# Patient Record
Sex: Female | Born: 1965 | Race: Black or African American | Hispanic: No | Marital: Single | Smoking: Former smoker
Health system: Southern US, Community
[De-identification: ages and names within clinical notes are randomized; demographics above are authoritative.]

## PROBLEM LIST (undated history)

## (undated) DIAGNOSIS — I10 Essential (primary) hypertension: Secondary | ICD-10-CM

## (undated) HISTORY — DX: Essential (primary) hypertension: I10

## (undated) HISTORY — PX: CARPAL TUNNEL RELEASE: SHX101

## (undated) HISTORY — PX: CHOLECYSTECTOMY: SHX55

## (undated) HISTORY — PX: TUBAL LIGATION: SHX77

## (undated) HISTORY — PX: HIP SURGERY: SHX245

---

## 2003-01-13 ENCOUNTER — Emergency Department (HOSPITAL_COMMUNITY): Admission: EM | Admit: 2003-01-13 | Discharge: 2003-01-14 | Payer: Self-pay | Admitting: Emergency Medicine

## 2014-12-03 ENCOUNTER — Encounter (HOSPITAL_COMMUNITY): Payer: Self-pay | Admitting: Emergency Medicine

## 2014-12-03 ENCOUNTER — Emergency Department (HOSPITAL_COMMUNITY)
Admission: EM | Admit: 2014-12-03 | Discharge: 2014-12-04 | Disposition: A | Discharge status: Home / Self Care | Payer: Self-pay | Attending: Emergency Medicine | Admitting: Emergency Medicine

## 2014-12-03 DIAGNOSIS — D259 Leiomyoma of uterus, unspecified: Secondary | ICD-10-CM | POA: Insufficient documentation

## 2014-12-03 DIAGNOSIS — Z9851 Tubal ligation status: Secondary | ICD-10-CM | POA: Insufficient documentation

## 2014-12-03 DIAGNOSIS — R102 Pelvic and perineal pain: Secondary | ICD-10-CM | POA: Insufficient documentation

## 2014-12-03 DIAGNOSIS — R21 Rash and other nonspecific skin eruption: Secondary | ICD-10-CM | POA: Insufficient documentation

## 2014-12-03 DIAGNOSIS — Z9049 Acquired absence of other specified parts of digestive tract: Secondary | ICD-10-CM | POA: Insufficient documentation

## 2014-12-03 DIAGNOSIS — R109 Unspecified abdominal pain: Secondary | ICD-10-CM

## 2014-12-03 DIAGNOSIS — Z720 Tobacco use: Secondary | ICD-10-CM | POA: Insufficient documentation

## 2014-12-03 DIAGNOSIS — N830 Follicular cyst of ovary: Secondary | ICD-10-CM | POA: Insufficient documentation

## 2014-12-03 DIAGNOSIS — N83202 Unspecified ovarian cyst, left side: Secondary | ICD-10-CM

## 2014-12-03 LAB — COMPREHENSIVE METABOLIC PANEL
ALK PHOS: 82 U/L (ref 39–117)
ALT: 13 U/L (ref 0–35)
AST: 17 U/L (ref 0–37)
Albumin: 3.4 g/dL — ABNORMAL LOW (ref 3.5–5.2)
Anion gap: 7 (ref 5–15)
BUN: 8 mg/dL (ref 6–23)
CO2: 22 mmol/L (ref 19–32)
Calcium: 8.8 mg/dL (ref 8.4–10.5)
Chloride: 106 mmol/L (ref 96–112)
Creatinine, Ser: 0.97 mg/dL (ref 0.50–1.10)
GFR calc Af Amer: 79 mL/min — ABNORMAL LOW (ref >90)
GFR, EST NON AFRICAN AMERICAN: 68 mL/min — AB (ref >90)
Glucose, Bld: 114 mg/dL — ABNORMAL HIGH (ref 70–99)
Potassium: 4.3 mmol/L (ref 3.5–5.1)
Sodium: 135 mmol/L (ref 135–145)
TOTAL PROTEIN: 6.9 g/dL (ref 6.0–8.3)
Total Bilirubin: 0.5 mg/dL (ref 0.3–1.2)

## 2014-12-03 LAB — CBC WITH DIFFERENTIAL/PLATELET
Basophils Absolute: 0 10*3/uL (ref 0.0–0.1)
Basophils Relative: 0 % (ref 0–1)
EOS PCT: 0 % (ref 0–5)
Eosinophils Absolute: 0 10*3/uL (ref 0.0–0.7)
HCT: 37.5 % (ref 36.0–46.0)
HEMOGLOBIN: 12.3 g/dL (ref 12.0–15.0)
LYMPHS ABS: 1.3 10*3/uL (ref 0.7–4.0)
LYMPHS PCT: 13 % (ref 12–46)
MCH: 27.2 pg (ref 26.0–34.0)
MCHC: 32.8 g/dL (ref 30.0–36.0)
MCV: 83 fL (ref 78.0–100.0)
Monocytes Absolute: 0.5 10*3/uL (ref 0.1–1.0)
Monocytes Relative: 4 % (ref 3–12)
NEUTROS PCT: 83 % — AB (ref 43–77)
Neutro Abs: 8.9 10*3/uL — ABNORMAL HIGH (ref 1.7–7.7)
PLATELETS: 408 10*3/uL — AB (ref 150–400)
RBC: 4.52 MIL/uL (ref 3.87–5.11)
RDW: 17.6 % — AB (ref 11.5–15.5)
WBC: 10.7 10*3/uL — ABNORMAL HIGH (ref 4.0–10.5)

## 2014-12-03 LAB — URINALYSIS, ROUTINE W REFLEX MICROSCOPIC
BILIRUBIN URINE: NEGATIVE
Glucose, UA: NEGATIVE mg/dL
Ketones, ur: NEGATIVE mg/dL
NITRITE: NEGATIVE
PH: 6 (ref 5.0–8.0)
PROTEIN: NEGATIVE mg/dL
SPECIFIC GRAVITY, URINE: 1.027 (ref 1.005–1.030)
UROBILINOGEN UA: 1 mg/dL (ref 0.0–1.0)

## 2014-12-03 LAB — URINE MICROSCOPIC-ADD ON

## 2014-12-03 LAB — PREGNANCY, URINE: Preg Test, Ur: NEGATIVE

## 2014-12-03 MED ORDER — SODIUM CHLORIDE 0.9 % IV BOLUS (SEPSIS)
1000.0000 mL | Freq: Once | INTRAVENOUS | Status: AC
  Administered 2014-12-03: 1000 mL via INTRAVENOUS

## 2014-12-03 MED ORDER — MORPHINE SULFATE 4 MG/ML IJ SOLN
4.0000 mg | Freq: Once | INTRAMUSCULAR | Status: AC
  Administered 2014-12-03: 4 mg via INTRAVENOUS
  Filled 2014-12-03: qty 1

## 2014-12-03 MED ORDER — KETOROLAC TROMETHAMINE 30 MG/ML IJ SOLN
30.0000 mg | Freq: Once | INTRAMUSCULAR | Status: AC
  Administered 2014-12-03: 30 mg via INTRAVENOUS
  Filled 2014-12-03: qty 1

## 2014-12-03 MED ORDER — ONDANSETRON HCL 4 MG/2ML IJ SOLN
4.0000 mg | Freq: Once | INTRAMUSCULAR | Status: AC
  Administered 2014-12-03: 4 mg via INTRAVENOUS
  Filled 2014-12-03: qty 2

## 2014-12-03 MED ORDER — DIPHENHYDRAMINE HCL 50 MG/ML IJ SOLN
25.0000 mg | Freq: Once | INTRAMUSCULAR | Status: AC
  Administered 2014-12-04: 25 mg via INTRAVENOUS
  Filled 2014-12-03: qty 1

## 2014-12-03 NOTE — ED Notes (Signed)
Pt. reports LLQ pain radiating to left lower back with itchy rashes at upper back and lips , emesis x2 yesterday , denies fever or diarrhea.

## 2014-12-03 NOTE — ED Provider Notes (Signed)
CSN: [id number]     Arrival date & time 12/03/14  2024 History   First MD Initiated Contact with Patient 12/03/14 2254     Chief Complaint  Patient presents with  . Abdominal Pain  . Back Pain  . Rash     (Consider location/radiation/quality/duration/timing/severity/associated sxs/prior Treatment) HPI Comments: Patient is a 49 yo F PMHx significant for tobacco abuse presenting to the ED for left sided abdominal pain with radiation from back that began Sunday. She states the pain as intermittent severe sharp pain. She states the pain has increased intensity and severity over the last two days. Endorses associated nausea and two episodes of non-bloody non-bilious emesis last evening. No medications PTA. No modifying factors identified. States she was seen at an "[place]" on Sunday and sent home. Patient also complaining of pruritic rash on her upper back and lips without shortness of breath, lip or tongue swelling, no medications taken for this. Denies any new foods, lotions, detergents, etc. Abdominal surgical history includes cholecystectomy, tubal ligation.   Patient is a 49 y.o. female presenting with abdominal pain, back pain, and rash.  Abdominal Pain Associated symptoms: dysuria, hematuria, nausea and vomiting   Associated symptoms: no diarrhea, no vaginal bleeding and no vaginal discharge   Back Pain Associated symptoms: abdominal pain and dysuria   Associated symptoms: no pelvic pain   Rash Associated symptoms: abdominal pain, nausea and vomiting   Associated symptoms: no diarrhea     History reviewed. No pertinent past medical history. Past Surgical History  Procedure Laterality Date  . Hip surgery    . Cholecystectomy    . Tubal ligation    . Carpal tunnel release     No family history on file. History  Substance Use Topics  . Smoking status: Current Every Day Smoker  . Smokeless tobacco: Not on file  . Alcohol Use: No   OB History    No data available       Review of Systems  Gastrointestinal: Positive for nausea, vomiting and abdominal pain. Negative for diarrhea.  Genitourinary: Positive for dysuria, urgency, frequency, hematuria and decreased urine volume. Negative for vaginal bleeding, vaginal discharge, vaginal pain, menstrual problem and pelvic pain.  Musculoskeletal: Positive for back pain.  Skin: Positive for rash.  All other systems reviewed and are negative.     Allergies  Review of patient's allergies indicates no known allergies.  Home Medications   Prior to Admission medications   Not on File   BP 130/88 mmHg  Pulse 103  Temp(Src) 98.6 F (37 C) (Oral)  Resp 18  Ht 5\' 1"  (1.549 m)  Wt 210 lb (95.255 kg)  BMI 39.70 kg/m2  SpO2 99%  LMP 11/11/2014 Physical Exam  Constitutional: She is oriented to person, place, and time. She appears well-developed and well-nourished. No distress.  Patient writhing on stretcher  HENT:  Head: Normocephalic and atraumatic.  Right Ear: External ear normal.  Left Ear: External ear normal.  Nose: Nose normal.  Mouth/Throat: Oropharynx is clear and moist.  Eyes: Conjunctivae are normal.  Neck: Neck supple.  Cardiovascular: Normal rate, regular rhythm and normal heart sounds.   Pulmonary/Chest: Effort normal and breath sounds normal. No respiratory distress.  Abdominal: Soft. Bowel sounds are normal. She exhibits no distension. There is tenderness. There is CVA tenderness (left sided). There is no rigidity, no rebound and no guarding.    Neurological: She is alert and oriented to person, place, and time.  Moves all extremities without ataxia.  Skin: Skin is warm and dry. No rash noted. She is not diaphoretic.  Nursing note and vitals reviewed.  Exam performed by Baron Sane L,  exam chaperoned Date: 12/04/2014 Pelvic exam: normal external genitalia without evidence of trauma. VULVA: normal appearing vulva with no masses, tenderness or lesion. VAGINA: normal  appearing vagina with normal color and discharge, no lesions. CERVIX: normal appearing cervix without lesions, cervical motion tenderness absent, cervical os closed with out purulent discharge; vaginal discharge - clear, Wet prep and DNA probe for chlamydia and GC obtained.   ADNEXA: normal adnexa in size, nontender r side, tender left adnexa and no masses UTERUS: uterus is normal size, shape, consistency and nontender.    ED Course  Procedures (including critical care time) Medications  sodium chloride 0.9 % bolus 1,000 mL (1,000 mLs Intravenous New Bag/Given 12/03/14 2356)  ondansetron (ZOFRAN) injection 4 mg (4 mg Intravenous Given 12/03/14 2356)  morphine 4 MG/ML injection 4 mg (4 mg Intravenous Given 12/03/14 2356)  ketorolac (TORADOL) 30 MG/ML injection 30 mg (30 mg Intravenous Given 12/03/14 2357)  diphenhydrAMINE (BENADRYL) injection 25 mg (25 mg Intravenous Given 12/04/14 0004)    Labs Review Labs Reviewed  CBC WITH DIFFERENTIAL/PLATELET - Abnormal; Notable for the following:    WBC 10.7 (*)    RDW 17.6 (*)    Platelets 408 (*)    Neutrophils Relative % 83 (*)    Neutro Abs 8.9 (*)    All other components within normal limits  COMPREHENSIVE METABOLIC PANEL - Abnormal; Notable for the following:    Glucose, Bld 114 (*)    Albumin 3.4 (*)    GFR calc non Af Amer 68 (*)    GFR calc Af Amer 79 (*)    All other components within normal limits  URINALYSIS, ROUTINE W REFLEX MICROSCOPIC - Abnormal; Notable for the following:    Hgb urine dipstick MODERATE (*)    Leukocytes, UA SMALL (*)    All other components within normal limits  URINE MICROSCOPIC-ADD ON - Abnormal; Notable for the following:    Squamous Epithelial / LPF FEW (*)    All other components within normal limits  WET PREP, GENITAL  PREGNANCY, URINE  GC/CHLAMYDIA PROBE AMP ([place])    Imaging Review Ct Renal Stone Study  12/04/2014   CLINICAL DATA:  Left lower quadrant and left back pain for 2 days.  EXAM: CT  ABDOMEN AND PELVIS WITHOUT CONTRAST  TECHNIQUE: Multidetector CT imaging of the abdomen and pelvis was performed following the standard protocol without IV contrast.  COMPARISON:  06/27/2012  FINDINGS: Lower chest: The lung bases are clear except for dependent atelectasis. Remote healed left rib fracture noted. The heart is normal in size. No pericardial effusion.  Hepatobiliary: The unenhanced appearance of the liver is grossly normal. No focal lesions or intrahepatic biliary dilatation. The gallbladder is surgically absent. No common bile duct dilatation.  Pancreas: Normal  Spleen: Normal  Adrenals/Urinary Tract: The adrenal glands and kidneys are unremarkable. No renal or obstructing ureteral calculi. No bladder calculi.  Stomach/Bowel: The stomach, duodenum, small bowel and colon are grossly normal without oral contrast. No obvious inflammatory changes, mass lesions or obstructive findings. The appendix is normal.  Vascular/Lymphatic: No mesenteric or retroperitoneal mass or adenopathy. The aorta is normal in caliber. No atherosclerotic changes.  Reproductive: The uterus is unremarkable. The left ovary is enlarged. No obvious cyst. The right ovary appears normal. The bladder is grossly normal. There is significant artifact in the pelvis related  to left pelvic hardware. Slight increased attenuation of the bladder could be due to debris or blood. Recommend correlation with urinalysis.  Other: No abdominal wall hernia or subcutaneous lesions.  Musculoskeletal: No acute bony findings. Surgical changes related to previous left-sided pelvic fracture fixation.  IMPRESSION: 1. Enlarged left ovary without obvious cysts. Pelvic ultrasound may be helpful for further evaluation. 2. No acute abdominal findings, mass lesions or adenopathy.   Electronically Signed   By: Kalman Jewels M.D.   On: 12/04/2014 00:55     EKG Interpretation None      12:29 AM on reevaluation pain significantly improved, tachycardia is  improved as well.    MDM   Final diagnoses:  Left sided abdominal pain  Pelvic pain in female    Filed Vitals:   12/04/14 0000  BP: 130/88  Pulse: 103  Temp: 98.6 F (37 C)  Resp: 18    Afebrile, NAD, non-toxic appearing, AAOx4.  I have reviewed nursing notes, vital signs, and all appropriate lab and imaging results for this patient.  Pelvic cyst noted on CT scan, pelvic performed. [place] pending at shift change.  Signed out to Junius Creamer, NP pending [place] results.     Harlow Mares, PA-C 12/04/14 0125  Blanchie Dessert, MD 12/04/14 2340

## 2014-12-04 ENCOUNTER — Emergency Department (HOSPITAL_COMMUNITY): Payer: Self-pay

## 2014-12-04 LAB — WET PREP, GENITAL
CLUE CELLS WET PREP: NONE SEEN
Trich, Wet Prep: NONE SEEN
YEAST WET PREP: NONE SEEN

## 2014-12-04 MED ORDER — MORPHINE SULFATE 4 MG/ML IJ SOLN
4.0000 mg | Freq: Once | INTRAMUSCULAR | Status: AC
  Administered 2014-12-04: 4 mg via INTRAVENOUS
  Filled 2014-12-04: qty 1

## 2014-12-04 MED ORDER — OXYCODONE-ACETAMINOPHEN 5-325 MG PO TABS: 1.0000 | ORAL_TABLET | Freq: Four times a day (QID) | ORAL | Status: DC | PRN

## 2014-12-04 MED ORDER — ONDANSETRON 4 MG PO TBDP: 4.0000 mg | ORAL_TABLET | Freq: Three times a day (TID) | ORAL | Status: DC | PRN

## 2014-12-04 NOTE — Discharge Instructions (Signed)
You have both a uterine fibroid and a large ovarian cyst Please make an appointment with the [facility] hospital for further evaluation and treatment  Abdominal Pain Many things can cause abdominal pain. Usually, abdominal pain is not caused by a disease and will improve without treatment. It can often be observed and treated at home. Your health care provider will do a physical exam and possibly order blood tests and X-rays to help determine the seriousness of your pain. However, in many cases, more time must pass before a clear cause of the pain can be found. Before that point, your health care provider may not know if you need more testing or further treatment. HOME CARE INSTRUCTIONS  Monitor your abdominal pain for any changes. The following actions may help to alleviate any discomfort you are experiencing:  Only take over-the-counter or prescription medicines as directed by your health care provider.  Do not take laxatives unless directed to do so by your health care provider.  Try a clear liquid diet (broth, tea, or water) as directed by your health care provider. Slowly move to a bland diet as tolerated. SEEK MEDICAL CARE IF:  You have unexplained abdominal pain.  You have abdominal pain associated with nausea or diarrhea.  You have pain when you urinate or have a bowel movement.  You experience abdominal pain that wakes you in the night.  You have abdominal pain that is worsened or improved by eating food.  You have abdominal pain that is worsened with eating fatty foods.  You have a fever. SEEK IMMEDIATE MEDICAL CARE IF:   Your pain does not go away within 2 hours.  You keep throwing up (vomiting).  Your pain is felt only in portions of the abdomen, such as the right side or the left lower portion of the abdomen.  You pass bloody or black tarry stools. MAKE SURE YOU:  Understand these instructions.   Will watch your condition.   Will get help right away if you are not  doing well or get worse.  Document Released: 07/25/2005 Document Revised: 10/20/2013 Document Reviewed: 06/24/2013 [facility] Patient Information 2015 [place], [place]. This information is not intended to replace advice given to you by your health care provider. Make sure you discuss any questions you have with your health care provider.

## 2014-12-04 NOTE — ED Provider Notes (Signed)
Ultrasound reviewed  Patient with ovarian cyst and fibroids will be referred to [facility] for [place], NP 12/04/14 5726  Kalman Drape, MD 12/04/14 [phone]

## 2014-12-06 LAB — GC/CHLAMYDIA PROBE AMP (~~LOC~~) NOT AT ARMC
CHLAMYDIA, DNA PROBE: NEGATIVE
Neisseria Gonorrhea: NEGATIVE

## 2014-12-14 ENCOUNTER — Encounter: Payer: Self-pay | Admitting: *Deleted

## 2014-12-14 NOTE — Progress Notes (Signed)
Received an email from Mr. Evelene Croon stating he was the patient's fiance and is concerned the patient has been waiting for an appointment for 2 weeks.  He states she is in severe pain for an ovarian cyst.  I have contacted the patient via phone and explained the referral from [facility] was received via fax on 12/08/14.  I further explained the referral had to be reviewed by a provider to see if there were any necessary tests that are needed prior to being seen in the office.  I told patient I should be able to have physician review the referral and get back to her no later than 12 pm on Thursday to let her know when we could see her in our office.  Patient states understanding.  She inquired about pain medicine.  Encouraged patient to take 800 mg of Motrin over the counter and alternate with 500 mg of tylenol.  Also encouraged a heating pad to the area.  Patient also complains of being on her period now and passing clots the size of a half dollar.  States she has been doing this for the last 3-4 years.  Explained to patient when she sees the provider they will discuss all her symptoms and examine her and together they will develop a plan of care.  Patient states understanding.

## 2014-12-15 ENCOUNTER — Encounter: Payer: Self-pay | Admitting: *Deleted

## 2014-12-15 ENCOUNTER — Ambulatory Visit (INDEPENDENT_AMBULATORY_CARE_PROVIDER_SITE_OTHER): Payer: Self-pay | Admitting: Obstetrics & Gynecology

## 2014-12-15 ENCOUNTER — Encounter: Payer: Self-pay | Admitting: Obstetrics & Gynecology

## 2014-12-15 VITALS — BP 152/94 | HR 92 | Temp 97.7°F | Ht 61.0 in | Wt 201.2 lb

## 2014-12-15 DIAGNOSIS — N83209 Unspecified ovarian cyst, unspecified side: Secondary | ICD-10-CM

## 2014-12-15 DIAGNOSIS — N8320 Unspecified ovarian cysts: Secondary | ICD-10-CM

## 2014-12-15 MED ORDER — OXYCODONE-ACETAMINOPHEN 5-325 MG PO TABS: 1.0000 | ORAL_TABLET | Freq: Four times a day (QID) | ORAL | Status: DC | PRN

## 2014-12-15 MED ORDER — NAPROXEN 500 MG PO TABS: 500.0000 mg | ORAL_TABLET | Freq: Two times a day (BID) | ORAL | Status: DC

## 2014-12-15 NOTE — Patient Instructions (Signed)
Ovarian Cyst An ovarian cyst is a fluid-filled sac that forms on an ovary. The ovaries are small organs that produce eggs in women. Various types of cysts can form on the ovaries. Most are not cancerous. Many do not cause problems, and they often go away on their own. Some may cause symptoms and require treatment. Common types of ovarian cysts include:  Functional cysts--These cysts may occur every month during the menstrual cycle. This is normal. The cysts usually go away with the next menstrual cycle if the woman does not get pregnant. Usually, there are no symptoms with a functional cyst.  Endometrioma cysts--These cysts form from the tissue that lines the uterus. They are also called "chocolate cysts" because they become filled with blood that turns brown. This type of cyst can cause pain in the lower abdomen during intercourse and with your menstrual period.  Cystadenoma cysts--This type develops from the cells on the outside of the ovary. These cysts can get very big and cause lower abdomen pain and pain with intercourse. This type of cyst can twist on itself, cut off its blood supply, and cause severe pain. It can also easily rupture and cause a lot of pain.  Dermoid cysts--This type of cyst is sometimes found in both ovaries. These cysts may contain different kinds of body tissue, such as skin, teeth, hair, or cartilage. They usually do not cause symptoms unless they get very big.  Theca lutein cysts--These cysts occur when too much of a certain hormone (human chorionic gonadotropin) is produced and overstimulates the ovaries to produce an egg. This is most common after procedures used to assist with the conception of a baby (in vitro fertilization). CAUSES   Fertility drugs can cause a condition in which multiple large cysts are formed on the ovaries. This is called ovarian hyperstimulation syndrome.  A condition called polycystic ovary syndrome can cause hormonal imbalances that can lead to  nonfunctional ovarian cysts. SIGNS AND SYMPTOMS  Many ovarian cysts do not cause symptoms. If symptoms are present, they may include:  Pelvic pain or pressure.  Pain in the lower abdomen.  Pain during sexual intercourse.  Increasing girth (swelling) of the abdomen.  Abnormal menstrual periods.  Increasing pain with menstrual periods.  Stopping having menstrual periods without being pregnant. DIAGNOSIS  These cysts are commonly found during a routine or annual pelvic exam. Tests may be ordered to find out more about the cyst. These tests may include:  Ultrasound.  X-ray of the pelvis.  CT scan.  MRI.  Blood tests. TREATMENT  Many ovarian cysts go away on their own without treatment. Your health care provider may want to check your cyst regularly for 2-3 months to see if it changes. For women in menopause, it is particularly important to monitor a cyst closely because of the higher rate of ovarian cancer in menopausal women. When treatment is needed, it may include any of the following:  A procedure to drain the cyst (aspiration). This may be done using a long needle and ultrasound. It can also be done through a laparoscopic procedure. This involves using a thin, lighted tube with a tiny camera on the end (laparoscope) inserted through a small incision.  Surgery to remove the whole cyst. This may be done using laparoscopic surgery or an open surgery involving a larger incision in the lower abdomen.  Hormone treatment or birth control pills. These methods are sometimes used to help dissolve a cyst. HOME CARE INSTRUCTIONS   Only take over-the-counter   or prescription medicines as directed by your health care provider.  Follow up with your health care provider as directed.  Get regular pelvic exams and Pap tests. SEEK MEDICAL CARE IF:   Your periods are late, irregular, or painful, or they stop.  Your pelvic pain or abdominal pain does not go away.  Your abdomen becomes  larger or swollen.  You have pressure on your bladder or trouble emptying your bladder completely.  You have pain during sexual intercourse.  You have feelings of fullness, pressure, or discomfort in your stomach.  You lose weight for no apparent reason.  You feel generally ill.  You become constipated.  You lose your appetite.  You develop acne.  You have an increase in body and facial hair.  You are gaining weight, without changing your exercise and eating habits.  You think you are pregnant. SEEK IMMEDIATE MEDICAL CARE IF:   You have increasing abdominal pain.  You feel sick to your stomach (nauseous), and you throw up (vomit).  You develop a fever that comes on suddenly.  You have abdominal pain during a bowel movement.  Your menstrual periods become heavier than usual. MAKE SURE YOU:  Understand these instructions.  Will watch your condition.  Will get help right away if you are not doing well or get worse. Document Released: 10/15/2005 Document Revised: 10/20/2013 Document Reviewed: 06/22/2013 [employer] Patient Information 2015 [employer]. This information is not intended to replace advice given to you by your health care provider. Make sure you discuss any questions you have with your health care provider.  

## 2014-12-15 NOTE — Progress Notes (Signed)
Subjective:     Patient ID: Dawn Hardy, female   DOB: 12/29/1965, 49 y.o.   MRN: [id number]  HPI Pt was seen in the ED at [facility] 2 weeks prev.  She reports taht the pain occurred suddently. She thought that it was gallstondes or kidney stones.  She could not stand up.  She was initially dx'd with kidney stoindes butm they did not resolve.  She went back to the ED and was dx'd with an ovarian cyst.  She reports that she is on pain meds since that time with limited relief.  Pain is a little better than when she was initially seen.  She reports that the pain is positional. She is taking Percocet at home for pain.  Past Medical History  Diagnosis Date  . Hypertension    Past Surgical History  Procedure Laterality Date  . Hip surgery    . Cholecystectomy    . Tubal ligation    . Carpal tunnel release     Current Outpatient Prescriptions on File Prior to Visit  Medication Sig Dispense Refill  . hydrochlorothiazide (HYDRODIURIL) 25 MG tablet Take 25 mg by mouth daily.     No current facility-administered medications on file prior to visit.   Allergies  Allergen Reactions  . Tramadol Swelling     Review of Systems     Objective:   Physical Exam BP 152/94 mmHg  Pulse 92  Temp(Src) 97.7 F (36.5 C) (Oral)  Ht 5\' 1"  (1.549 m)  Wt 201 lb 3.2 oz (91.264 kg)  BMI 38.04 kg/m2  LMP 12/12/2014 Pt in NAD Lungs: CTA CV: RRR Abd: soft, NT, ND.  No rebound no guarding GU: deferred as pt on her menses     CLINICAL DATA: Enlarged left ovary on CT. Pelvic pain.  EXAM: TRANSABDOMINAL AND TRANSVAGINAL ULTRASOUND OF PELVIS  DOPPLER ULTRASOUND OF OVARIES  TECHNIQUE: Both transabdominal and transvaginal ultrasound examinations of the pelvis were performed. Transabdominal technique was performed for global imaging of the pelvis including uterus, ovaries, adnexal regions, and pelvic cul-de-sac.  It was necessary to proceed with endovaginal exam following the transabdominal exam to  visualize the uterus and adnexa. Color and duplex Doppler ultrasound was utilized to evaluate blood flow to the ovaries.  COMPARISON: CT earlier this day.  FINDINGS: Uterus  Measurements: 10.0 x 4.8 x 6.3 cm. There is a shadowing 2.2 x 1.9 x 1.9 cm fibroid in the uterine body posteriorly.  Endometrium  Thickness: 3 mm. No focal abnormality visualized. Mild mass effect from posterior fibroid.  Right ovary  Measurements: 2.8 x 1.2 x 2.0 cm. Normal appearance/no adnexal mass.  Left ovary  Measurements: Enlarged measuring 7.3 x 3.8 x 4.1 cm. There are multiple cysts in the left ovary. Largest measures 4.3 x 4.1 x 3.6 cm with low-level internal echoes. Additional smaller cysts are seen. There is normal blood flow to the adjacent ovarian parenchyma.  Pulsed Doppler evaluation of both ovaries demonstrates normal low-resistance arterial and venous waveforms.  Other findings  No free fluid.  IMPRESSION: 1. Multiple cysts in the left ovary, largest measuring 4.3 cm with low-level internal echoes, may reflect a hemorrhagic cyst. There is no evidence of torsion, normal blood flow seen to the ovarian parenchyma. 2. Fibroid in the uterine body 2.2 cm.      Assessment:     Left ovary cyst- suspect hemorrhagic cyst.  Reviewed with pt.       Plan:     Percocet #30 prn Naproxsyn 500mg  po bid  #  60 TV sono in 4 weeks F/u in 5 weeks

## 2015-01-12 ENCOUNTER — Ambulatory Visit (HOSPITAL_COMMUNITY)
Admission: RE | Admit: 2015-01-12 | Discharge: 2015-01-12 | Disposition: A | Discharge status: Home / Self Care | Payer: Self-pay | Source: Ambulatory Visit | Attending: Obstetrics & Gynecology | Admitting: Obstetrics & Gynecology

## 2015-01-12 DIAGNOSIS — N83209 Unspecified ovarian cyst, unspecified side: Secondary | ICD-10-CM

## 2015-01-12 DIAGNOSIS — N8320 Unspecified ovarian cysts: Secondary | ICD-10-CM | POA: Insufficient documentation

## 2015-01-17 ENCOUNTER — Telehealth: Payer: Self-pay | Admitting: Obstetrics & Gynecology

## 2015-01-17 ENCOUNTER — Other Ambulatory Visit: Payer: Self-pay | Admitting: Obstetrics & Gynecology

## 2015-01-17 DIAGNOSIS — N83292 Other ovarian cyst, left side: Secondary | ICD-10-CM

## 2015-01-17 NOTE — Telephone Encounter (Signed)
TC to pt to notify of sono results.  She reports that her pain has improved since her last visit.  She does report that it is heavier today but, she is on her menses and this is normal for her.  She will f/u for a repeat sono in 3 months and see me for an appt after that sono.  Her questions were answered and she doe snot need to be seen in  2 days. Will reschedule for 3 months.  Runette Scifres L. Harraway-Smith, M.D., Cherlynn June

## 2015-01-18 NOTE — Progress Notes (Signed)
Called patient, no answer- left message stating we are trying to reach you to set up an appt, please call us back at the clinics

## 2015-01-19 ENCOUNTER — Ambulatory Visit: Payer: Self-pay | Admitting: Obstetrics & Gynecology

## 2015-01-19 ENCOUNTER — Telehealth: Payer: Self-pay

## 2015-01-19 DIAGNOSIS — N83209 Unspecified ovarian cyst, unspecified side: Secondary | ICD-10-CM

## 2015-01-19 MED ORDER — OXYCODONE-ACETAMINOPHEN 5-325 MG PO TABS: 1.0000 | ORAL_TABLET | Freq: Four times a day (QID) | ORAL | Status: DC | PRN

## 2015-01-19 NOTE — Telephone Encounter (Signed)
Patient returned call and states she is taking ibuprofen 2 pills every 4 hours for pain and reports it is not helping. Confirmed she is taking ibuprofen 200mg  tabs and therefore 400mg  every 4 hours. Informed patient that she may take up to 800mg  every 6 hours-- patient verbalized frustration and states that no OTC or Naproxen is helping and she wants to speak to Dr. Ihor Dow. Informed patient I would speak to her and call her back. Dicussed with Dr. Ihor Dow-- agreed to refill Percocet. Patient informed and explained she will need to come to clinic to obtain. Patient verbalized understanding. No further questions or concerns.

## 2015-01-19 NOTE — Telephone Encounter (Signed)
Patient called stating she was told to call if her pain medication does not help her pain-- reports its no longer helping her pain. Attempted to call patient. No answer. Left message stating we are returning your call, please call clinic.

## 2015-01-24 ENCOUNTER — Telehealth: Payer: Self-pay | Admitting: General Practice

## 2015-01-24 NOTE — Telephone Encounter (Signed)
Patient called and left message stating she had a 7 day period and now she has blood in her urine, please call back. Called patient and she states that she just went off her period which lasted a week and last night she started spotting and isn't sure where the blood is coming from. Patient denies UTI symptoms. Reviewed recent ultrasound with patient and patient states she didn't know if the bleeding was coming from the cyst or not. Told patient no that the cyst wouldn't cause bleeding and that changes in her period can be caused from many factors such as increased stress. Told patient it appears Dr Ihor Dow wanted her to follow up with [place], told patient I will send a message to the front office staff to contact her with an appt for around end of April for follow up. Told patient in the meantime to keep track of bleeding and periods and to discuss that at her follow up appt. Told patient to go to ER should she have heavy bleeding where she is saturating a pad in less than a hour. Patient verbalized understanding to all and had no other questions

## 2015-02-23 ENCOUNTER — Ambulatory Visit: Payer: Self-pay | Admitting: Obstetrics & Gynecology

## 2015-03-18 ENCOUNTER — Ambulatory Visit (INDEPENDENT_AMBULATORY_CARE_PROVIDER_SITE_OTHER): Payer: Self-pay | Admitting: Obstetrics & Gynecology

## 2015-03-18 ENCOUNTER — Encounter: Payer: Self-pay | Admitting: Obstetrics & Gynecology

## 2015-03-18 VITALS — BP 151/93 | HR 86 | Temp 97.7°F | Ht 61.0 in | Wt 202.9 lb

## 2015-03-18 DIAGNOSIS — N83202 Unspecified ovarian cyst, left side: Secondary | ICD-10-CM

## 2015-03-18 DIAGNOSIS — N8320 Unspecified ovarian cysts: Secondary | ICD-10-CM

## 2015-03-18 NOTE — Patient Instructions (Addendum)
Perimenopause Perimenopause is the time when your body begins to move into the menopause (no menstrual period for 12 straight months). It is a natural process. Perimenopause can begin 2-8 years before the menopause and usually lasts for 1 year after the menopause. During this time, your ovaries may or may not produce an egg. The ovaries vary in their production of estrogen and progesterone hormones each month. This can cause irregular menstrual periods, difficulty getting pregnant, vaginal bleeding between periods, and uncomfortable symptoms. CAUSES  Irregular production of the ovarian hormones, estrogen and progesterone, and not ovulating every month.  Other causes include:  Tumor of the pituitary gland in the brain.  Medical disease that affects the ovaries.  Radiation treatment.  Chemotherapy.  Unknown causes.  Heavy smoking and excessive alcohol intake can bring on perimenopause sooner. SIGNS AND SYMPTOMS   Hot flashes.  Night sweats.  Irregular menstrual periods.  Decreased sex drive.  Vaginal dryness.  Headaches.  Mood swings.  Depression.  Memory problems.  Irritability.  Tiredness.  Weight gain.  Trouble getting pregnant.  The beginning of losing bone cells (osteoporosis).  The beginning of hardening of the arteries (atherosclerosis). DIAGNOSIS  Your health care provider will make a diagnosis by analyzing your age, menstrual history, and symptoms. He or she will do a physical exam and note any changes in your body, especially your female organs. Female hormone tests may or may not be helpful depending on the amount of female hormones you produce and when you produce them. However, other hormone tests may be helpful to rule out other problems. TREATMENT  In some cases, no treatment is needed. The decision on whether treatment is necessary during the perimenopause should be made by you and your health care provider based on how the symptoms are affecting you  and your lifestyle. Various treatments are available, such as:  Treating individual symptoms with a specific medicine for that symptom.  Herbal medicines that can help specific symptoms.  Counseling.  Group therapy. HOME CARE INSTRUCTIONS   Keep track of your menstrual periods (when they occur, how heavy they are, how long between periods, and how long they last) as well as your symptoms and when they started.  Only take over-the-counter or prescription medicines as directed by your health care provider.  Sleep and rest.  Exercise.  Eat a diet that contains calcium (good for your bones) and soy (acts like the estrogen hormone).  Do not smoke.  Avoid alcoholic beverages.  Take vitamin supplements as recommended by your health care provider. Taking vitamin E may help in certain cases.  Take calcium and vitamin D supplements to help prevent bone loss.  Group therapy is sometimes helpful.  Acupuncture may help in some cases. SEEK MEDICAL CARE IF:   You have questions about any symptoms you are having.  You need a referral to a specialist (gynecologist, psychiatrist, or psychologist). SEEK IMMEDIATE MEDICAL CARE IF:   You have vaginal bleeding.  Your period lasts longer than 8 days.  Your periods are recurring sooner than 21 days.  You have bleeding after intercourse.  You have severe depression.  You have pain when you urinate.  You have severe headaches.  You have vision problems. Document Released: 11/22/2004 Document Revised: 08/05/2013 Document Reviewed: 05/14/2013 [facility] Patient Information 2015 [place], [place]. This information is not intended to replace advice given to you by your health care provider. Make sure you discuss any questions you have with your health care provider.  Ovarian Cyst An ovarian  cyst is a fluid-filled sac that forms on an ovary. The ovaries are small organs that produce eggs in women. Various types of cysts can form on the  ovaries. Most are not cancerous. Many do not cause problems, and they often go away on their own. Some may cause symptoms and require treatment. Common types of ovarian cysts include:  Functional cysts--These cysts may occur every month during the menstrual cycle. This is normal. The cysts usually go away with the next menstrual cycle if the woman does not get pregnant. Usually, there are no symptoms with a functional cyst.  Endometrioma cysts--These cysts form from the tissue that lines the uterus. They are also called "chocolate cysts" because they become filled with blood that turns brown. This type of cyst can cause pain in the lower abdomen during intercourse and with your menstrual period.  Cystadenoma cysts--This type develops from the cells on the outside of the ovary. These cysts can get very big and cause lower abdomen pain and pain with intercourse. This type of cyst can twist on itself, cut off its blood supply, and cause severe pain. It can also easily rupture and cause a lot of pain.  Dermoid cysts--This type of cyst is sometimes found in both ovaries. These cysts may contain different kinds of body tissue, such as skin, teeth, hair, or cartilage. They usually do not cause symptoms unless they get very big.  Theca lutein cysts--These cysts occur when too much of a certain hormone (human chorionic gonadotropin) is produced and overstimulates the ovaries to produce an egg. This is most common after procedures used to assist with the conception of a baby (in vitro fertilization). CAUSES   Fertility drugs can cause a condition in which multiple large cysts are formed on the ovaries. This is called ovarian hyperstimulation syndrome.  A condition called polycystic ovary syndrome can cause hormonal imbalances that can lead to nonfunctional ovarian cysts. SIGNS AND SYMPTOMS  Many ovarian cysts do not cause symptoms. If symptoms are present, they may include:  Pelvic pain or pressure.  Pain  in the lower abdomen.  Pain during sexual intercourse.  Increasing girth (swelling) of the abdomen.  Abnormal menstrual periods.  Increasing pain with menstrual periods.  Stopping having menstrual periods without being pregnant. DIAGNOSIS  These cysts are commonly found during a routine or annual pelvic exam. Tests may be ordered to find out more about the cyst. These tests may include:  Ultrasound.  X-ray of the pelvis.  CT scan.  MRI.  Blood tests. TREATMENT  Many ovarian cysts go away on their own without treatment. Your health care provider may want to check your cyst regularly for 2-3 months to see if it changes. For women in menopause, it is particularly important to monitor a cyst closely because of the higher rate of ovarian cancer in menopausal women. When treatment is needed, it may include any of the following:  A procedure to drain the cyst (aspiration). This may be done using a long needle and ultrasound. It can also be done through a laparoscopic procedure. This involves using a thin, lighted tube with a tiny camera on the end (laparoscope) inserted through a small incision.  Surgery to remove the whole cyst. This may be done using laparoscopic surgery or an open surgery involving a larger incision in the lower abdomen.  Hormone treatment or birth control pills. These methods are sometimes used to help dissolve a cyst. HOME CARE INSTRUCTIONS   Only take over-the-counter or prescription medicines  as directed by your health care provider.  Follow up with your health care provider as directed.  Get regular pelvic exams and Pap tests. SEEK MEDICAL CARE IF:   Your periods are late, irregular, or painful, or they stop.  Your pelvic pain or abdominal pain does not go away.  Your abdomen becomes larger or swollen.  You have pressure on your bladder or trouble emptying your bladder completely.  You have pain during sexual intercourse.  You have feelings of  fullness, pressure, or discomfort in your stomach.  You lose weight for no apparent reason.  You feel generally ill.  You become constipated.  You lose your appetite.  You develop acne.  You have an increase in body and facial hair.  You are gaining weight, without changing your exercise and eating habits.  You think you are pregnant. SEEK IMMEDIATE MEDICAL CARE IF:   You have increasing abdominal pain.  You feel sick to your stomach (nauseous), and you throw up (vomit).  You develop a fever that comes on suddenly.  You have abdominal pain during a bowel movement.  Your menstrual periods become heavier than usual. MAKE SURE YOU:  Understand these instructions.  Will watch your condition.  Will get help right away if you are not doing well or get worse. Document Released: 10/15/2005 Document Revised: 10/20/2013 Document Reviewed: 06/22/2013 [facility] Patient Information 2015 [place], [place]. This information is not intended to replace advice given to you by your health care provider. Make sure you discuss any questions you have with your health care provider.
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
## 2015-05-04 ENCOUNTER — Ambulatory Visit (HOSPITAL_COMMUNITY)
Admission: RE | Admit: 2015-05-04 | Discharge: 2015-05-04 | Disposition: A | Discharge status: Home / Self Care | Payer: Self-pay | Source: Ambulatory Visit | Attending: Obstetrics & Gynecology | Admitting: Obstetrics & Gynecology

## 2015-05-04 DIAGNOSIS — D250 Submucous leiomyoma of uterus: Secondary | ICD-10-CM | POA: Insufficient documentation

## 2015-05-04 DIAGNOSIS — N8320 Unspecified ovarian cysts: Secondary | ICD-10-CM | POA: Insufficient documentation

## 2015-05-04 DIAGNOSIS — N83202 Unspecified ovarian cyst, left side: Secondary | ICD-10-CM

## 2015-05-06 ENCOUNTER — Telehealth: Payer: Self-pay | Admitting: *Deleted

## 2015-05-06 NOTE — Telephone Encounter (Signed)
Contacted patient, informed patient that her cyst is smaller and to follow up with the clinic as needed, pt verbalizes understanding and has no further questions.

## 2017-03-27 ENCOUNTER — Emergency Department (HOSPITAL_BASED_OUTPATIENT_CLINIC_OR_DEPARTMENT_OTHER)
Admission: EM | Admit: 2017-03-27 | Discharge: 2017-03-27 | Disposition: A | Discharge status: Home / Self Care | Payer: Medicare Other | Attending: Emergency Medicine | Admitting: Emergency Medicine

## 2017-03-27 ENCOUNTER — Emergency Department (HOSPITAL_BASED_OUTPATIENT_CLINIC_OR_DEPARTMENT_OTHER): Payer: Medicare Other

## 2017-03-27 ENCOUNTER — Encounter (HOSPITAL_BASED_OUTPATIENT_CLINIC_OR_DEPARTMENT_OTHER): Payer: Self-pay | Admitting: Emergency Medicine

## 2017-03-27 DIAGNOSIS — Z87891 Personal history of nicotine dependence: Secondary | ICD-10-CM | POA: Diagnosis not present

## 2017-03-27 DIAGNOSIS — Z79899 Other long term (current) drug therapy: Secondary | ICD-10-CM | POA: Insufficient documentation

## 2017-03-27 DIAGNOSIS — M5432 Sciatica, left side: Secondary | ICD-10-CM | POA: Diagnosis not present

## 2017-03-27 DIAGNOSIS — I10 Essential (primary) hypertension: Secondary | ICD-10-CM | POA: Diagnosis not present

## 2017-03-27 DIAGNOSIS — M79652 Pain in left thigh: Secondary | ICD-10-CM | POA: Diagnosis present

## 2017-03-27 MED ORDER — CYCLOBENZAPRINE HCL 10 MG PO TABS
10.0000 mg | ORAL_TABLET | Freq: Once | ORAL | Status: AC
  Administered 2017-03-27: 10 mg via ORAL
  Filled 2017-03-27: qty 1

## 2017-03-27 MED ORDER — CYCLOBENZAPRINE HCL 10 MG PO TABS: 10.0000 mg | ORAL_TABLET | Freq: Two times a day (BID) | ORAL | 0 refills | Status: AC | PRN

## 2017-03-27 MED ORDER — OXYCODONE-ACETAMINOPHEN 5-325 MG PO TABS
1.0000 | ORAL_TABLET | Freq: Once | ORAL | Status: AC
  Administered 2017-03-27: 1 via ORAL
  Filled 2017-03-27: qty 1

## 2017-03-27 NOTE — ED Triage Notes (Signed)
Pt c/o LLE (hip to knee) pain; chronic, but worse lately w/o new injury
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
## 2017-03-27 NOTE — Discharge Instructions (Signed)
Please call [facility] to get established with primary care. You can also call Dr. Lorin [facility] office for orthopedic follow-up. Please use caution when taking Flexeril because it can make you more sleepy so don't take it before you have to drive. If you develop new symptoms including fever chills, numbness, tingling, weakness, please return to the emergency department for reevaluation.

## 2018-03-02 IMAGING — CR DG LUMBAR SPINE COMPLETE 4+V
5 series · 5 of 5 positions shown · non-contrast
Comparison: 12/06/2015

CLINICAL DATA: Back pain.  Pelvic fracture.

EXAM:
LUMBAR SPINE - COMPLETE 4+ VIEW

[t l-spine a.p.]
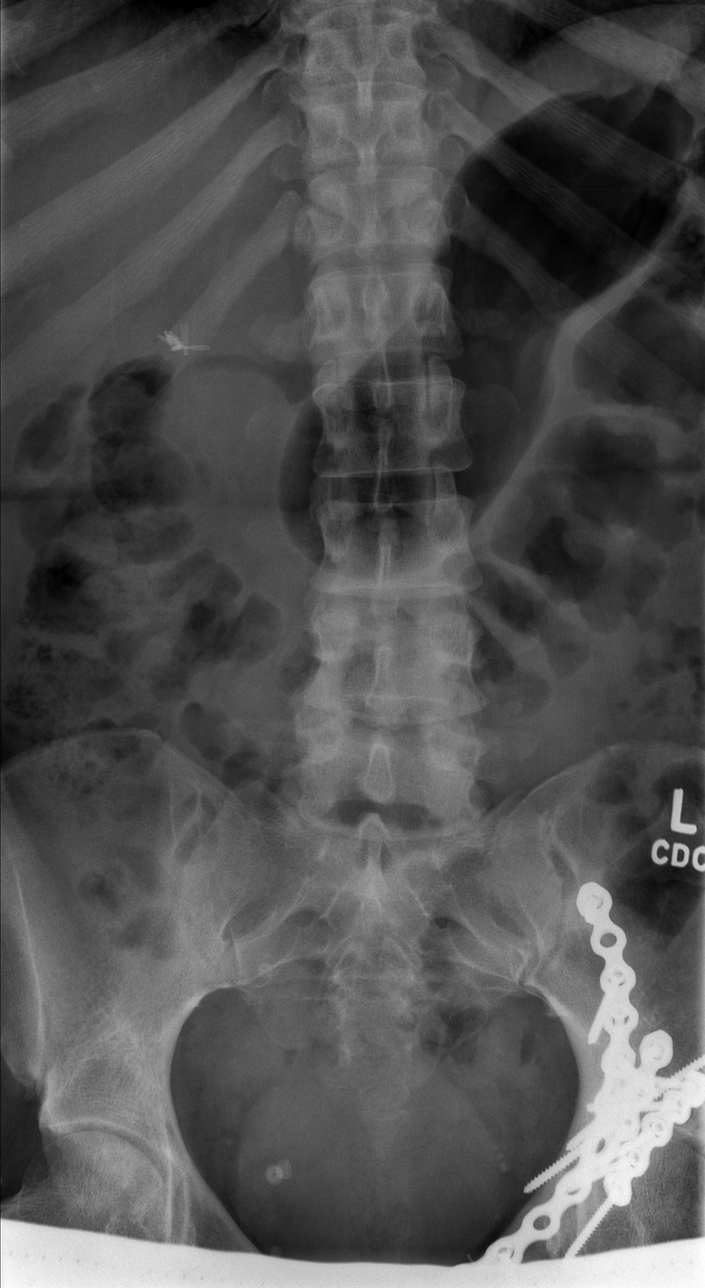

[t l-spine oblique exposure (1 of 2)]
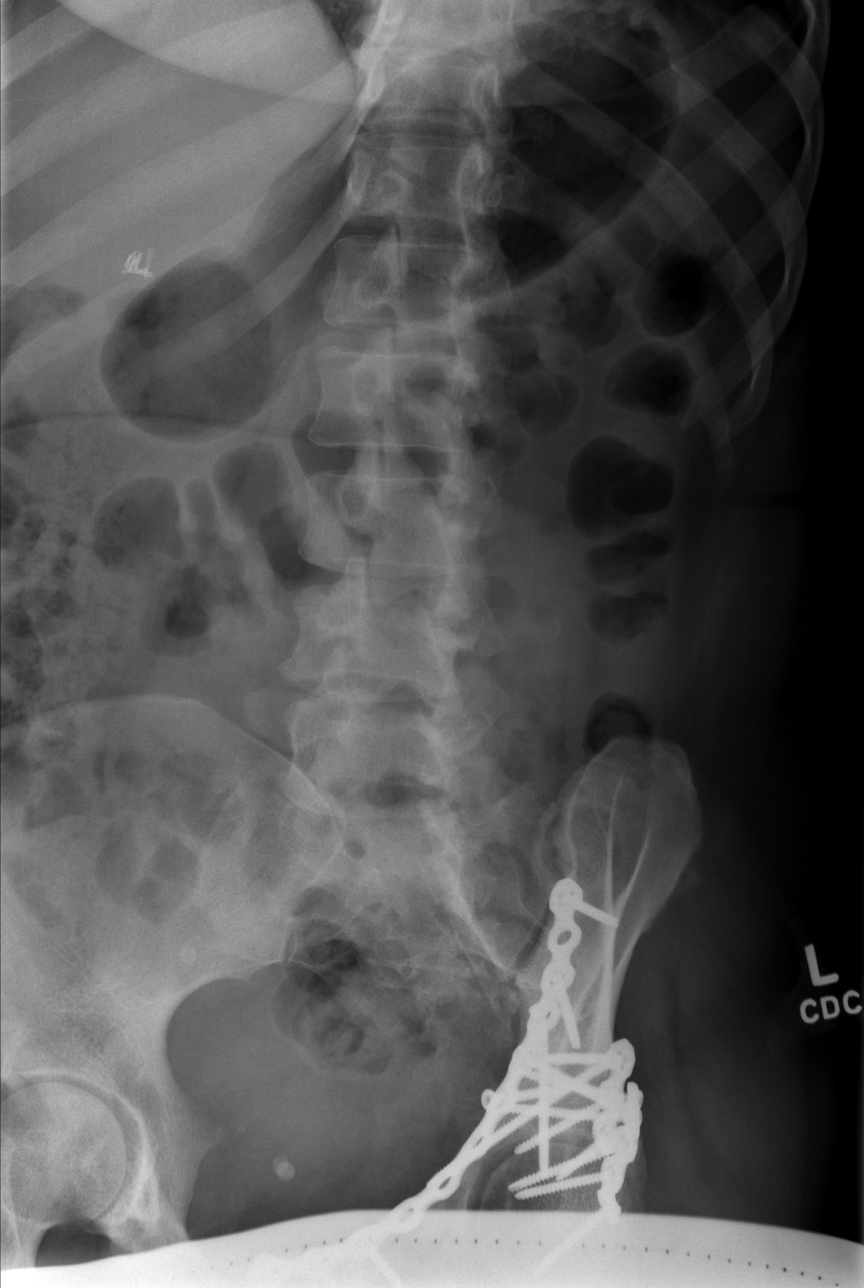

[t l-spine oblique exposure (2 of 2)]
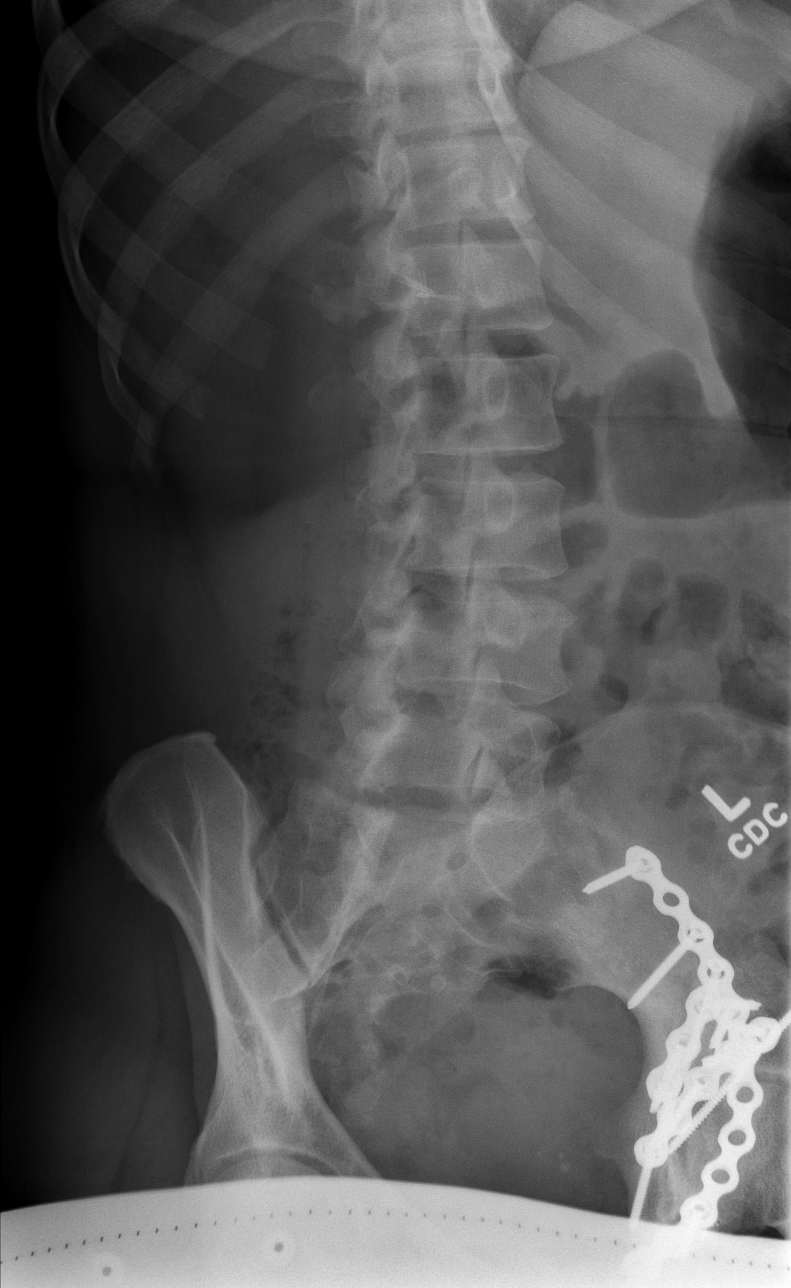

[t l-spine lat]
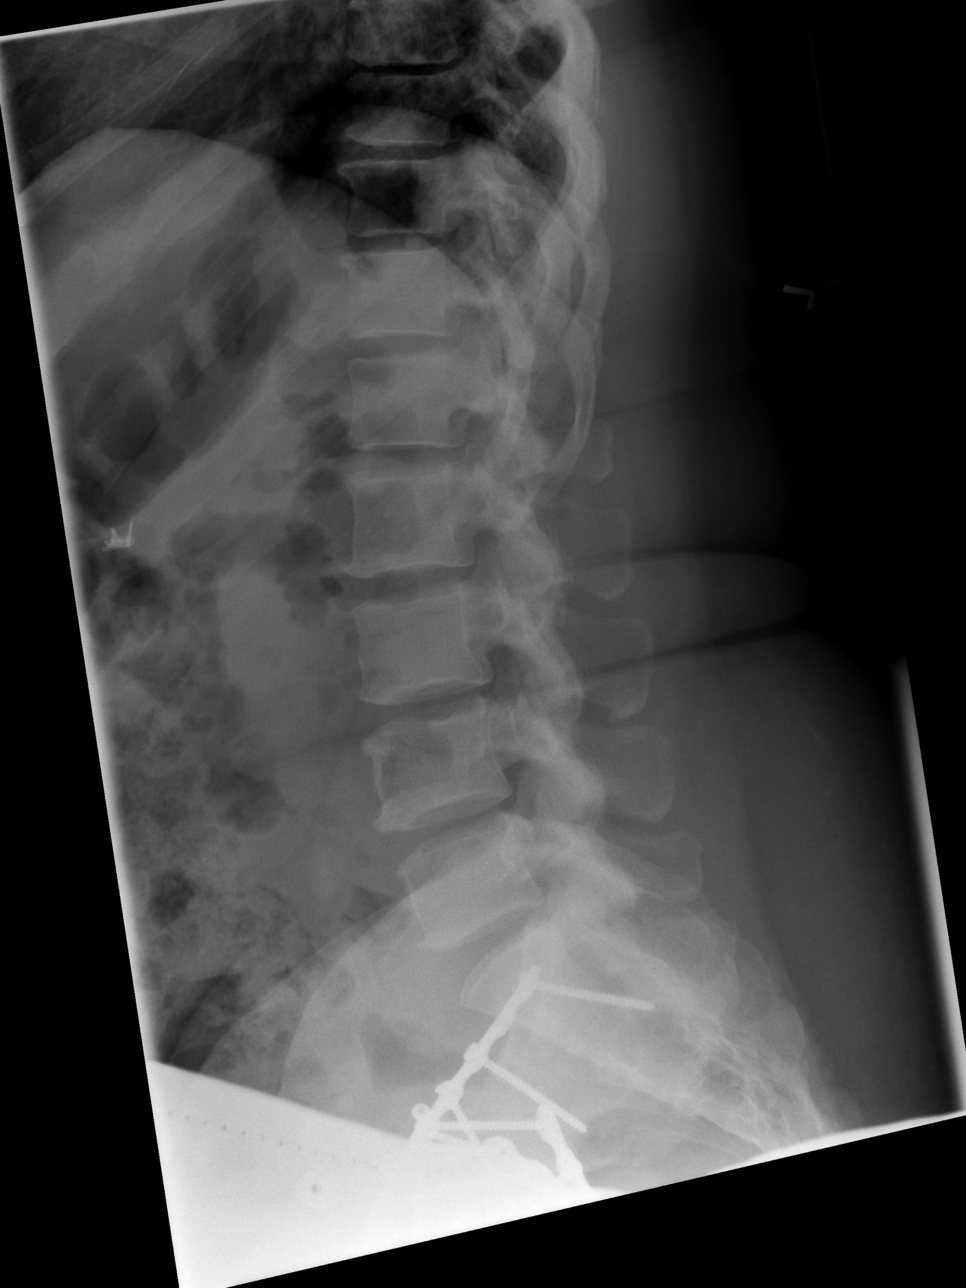

[t l-spine l5-s1 spot]
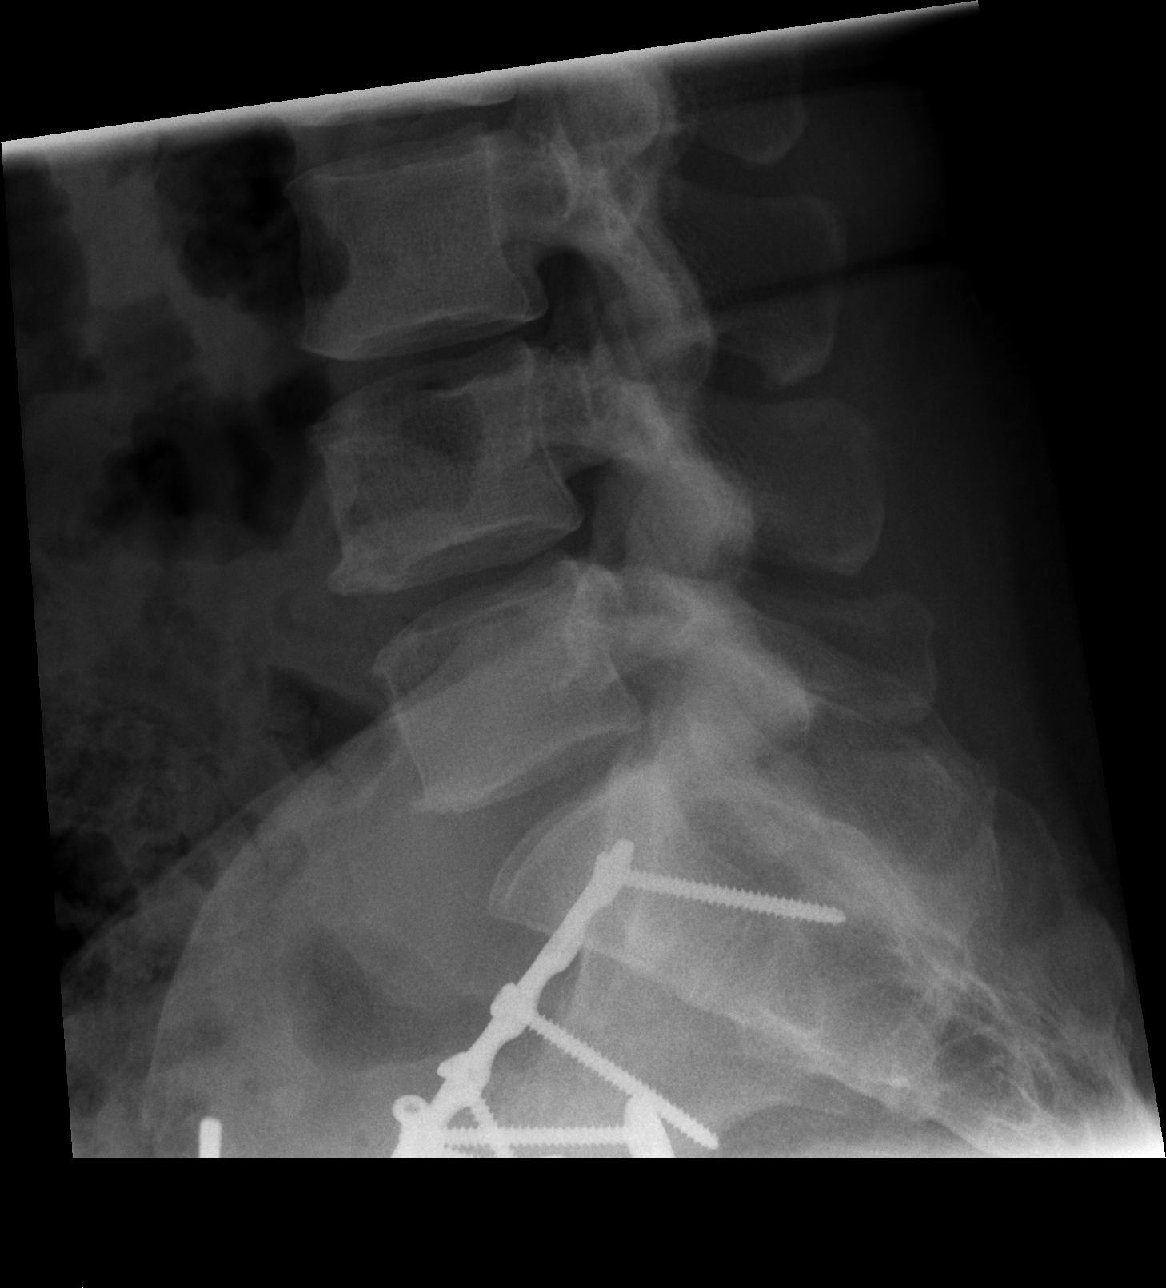

[5 of 5 positions shown; findings below may reference images not displayed]

FINDINGS: The alignment is anatomic. There is mild disc space narrowing at
L4-5. There appears to be a healed plate and screw fixation across a
LEFT acetabular fracture. Minimal scoliosis convex LEFT mid lumbar
region. Cholecystectomy.
IMPRESSION: No acute findings.
